# Patient Record
Sex: Female | Born: 1955 | Race: White | Hispanic: No | Marital: Married
Health system: Southern US, Community
[De-identification: ages and names within clinical notes are randomized; demographics above are authoritative.]

---

## 2001-05-07 ENCOUNTER — Encounter: Payer: Self-pay | Admitting: Emergency Medicine

## 2001-05-07 ENCOUNTER — Emergency Department (HOSPITAL_COMMUNITY): Admission: EM | Admit: 2001-05-07 | Discharge: 2001-05-07 | Payer: Self-pay | Admitting: Emergency Medicine

## 2003-09-03 ENCOUNTER — Encounter: Payer: Self-pay | Admitting: Neurosurgery

## 2003-09-07 ENCOUNTER — Encounter: Payer: Self-pay | Admitting: Neurosurgery

## 2003-09-07 ENCOUNTER — Inpatient Hospital Stay (HOSPITAL_COMMUNITY): Admission: RE | Admit: 2003-09-07 | Discharge: 2003-09-08 | Payer: Self-pay | Admitting: Neurosurgery

## 2003-09-22 ENCOUNTER — Encounter: Admission: RE | Admit: 2003-09-22 | Discharge: 2003-09-22 | Payer: Self-pay | Admitting: Neurosurgery

## 2004-08-18 ENCOUNTER — Ambulatory Visit: Payer: Self-pay | Admitting: Pain Medicine

## 2004-09-13 ENCOUNTER — Ambulatory Visit: Payer: Self-pay | Admitting: Pediatrics

## 2004-10-13 ENCOUNTER — Ambulatory Visit: Payer: Self-pay | Admitting: Pain Medicine

## 2004-11-15 ENCOUNTER — Ambulatory Visit: Payer: Self-pay | Admitting: Pain Medicine

## 2004-12-15 ENCOUNTER — Ambulatory Visit: Payer: Self-pay | Admitting: Pain Medicine

## 2005-01-12 ENCOUNTER — Ambulatory Visit: Payer: Self-pay | Admitting: Pain Medicine

## 2005-02-07 ENCOUNTER — Ambulatory Visit: Payer: Self-pay | Admitting: Pain Medicine

## 2005-03-14 ENCOUNTER — Ambulatory Visit: Payer: Self-pay | Admitting: Pain Medicine

## 2005-03-22 ENCOUNTER — Ambulatory Visit: Payer: Self-pay | Admitting: Pain Medicine

## 2005-04-13 ENCOUNTER — Ambulatory Visit: Payer: Self-pay | Admitting: Pain Medicine

## 2005-04-28 ENCOUNTER — Ambulatory Visit: Payer: Self-pay | Admitting: Internal Medicine

## 2005-05-01 ENCOUNTER — Ambulatory Visit: Payer: Self-pay | Admitting: Internal Medicine

## 2005-05-20 ENCOUNTER — Emergency Department: Payer: Self-pay | Admitting: Emergency Medicine

## 2005-05-20 ENCOUNTER — Other Ambulatory Visit: Payer: Self-pay

## 2005-06-01 ENCOUNTER — Ambulatory Visit: Payer: Self-pay | Admitting: Pain Medicine

## 2005-06-27 ENCOUNTER — Ambulatory Visit: Payer: Self-pay | Admitting: Pain Medicine

## 2005-08-08 ENCOUNTER — Ambulatory Visit: Payer: Self-pay | Admitting: Pain Medicine

## 2005-09-05 ENCOUNTER — Ambulatory Visit: Payer: Self-pay | Admitting: Pain Medicine

## 2005-10-03 ENCOUNTER — Ambulatory Visit: Payer: Self-pay | Admitting: Pain Medicine

## 2005-10-31 ENCOUNTER — Ambulatory Visit: Payer: Self-pay | Admitting: Pain Medicine

## 2005-11-28 ENCOUNTER — Ambulatory Visit: Payer: Self-pay | Admitting: Pain Medicine

## 2005-12-09 ENCOUNTER — Other Ambulatory Visit: Payer: Self-pay

## 2005-12-09 ENCOUNTER — Inpatient Hospital Stay: Payer: Self-pay | Admitting: Internal Medicine

## 2005-12-28 ENCOUNTER — Ambulatory Visit: Payer: Self-pay | Admitting: Pain Medicine

## 2006-01-23 ENCOUNTER — Ambulatory Visit: Payer: Self-pay | Admitting: Pain Medicine

## 2006-02-20 ENCOUNTER — Ambulatory Visit: Payer: Self-pay | Admitting: Pain Medicine

## 2006-03-20 ENCOUNTER — Ambulatory Visit: Payer: Self-pay | Admitting: Pain Medicine

## 2006-04-24 ENCOUNTER — Ambulatory Visit: Payer: Self-pay | Admitting: Pain Medicine

## 2006-05-24 ENCOUNTER — Ambulatory Visit: Payer: Self-pay | Admitting: Pain Medicine

## 2006-06-19 ENCOUNTER — Ambulatory Visit: Payer: Self-pay | Admitting: Pain Medicine

## 2006-06-27 ENCOUNTER — Ambulatory Visit: Payer: Self-pay | Admitting: Pain Medicine

## 2006-07-24 ENCOUNTER — Ambulatory Visit: Payer: Self-pay | Admitting: Pain Medicine

## 2006-08-23 ENCOUNTER — Ambulatory Visit: Payer: Self-pay | Admitting: Pain Medicine

## 2006-09-03 ENCOUNTER — Ambulatory Visit: Payer: Self-pay | Admitting: Pain Medicine

## 2006-09-13 ENCOUNTER — Ambulatory Visit: Payer: Self-pay | Admitting: Pain Medicine

## 2006-10-05 ENCOUNTER — Emergency Department: Payer: Self-pay | Admitting: Emergency Medicine

## 2006-10-05 ENCOUNTER — Other Ambulatory Visit: Payer: Self-pay

## 2006-10-18 ENCOUNTER — Ambulatory Visit: Payer: Self-pay | Admitting: Pain Medicine

## 2006-11-22 ENCOUNTER — Ambulatory Visit: Payer: Self-pay | Admitting: Pain Medicine

## 2006-12-18 ENCOUNTER — Ambulatory Visit: Payer: Self-pay | Admitting: Pain Medicine

## 2007-01-17 ENCOUNTER — Ambulatory Visit: Payer: Self-pay | Admitting: Pain Medicine

## 2007-02-21 ENCOUNTER — Ambulatory Visit: Payer: Self-pay | Admitting: Pain Medicine

## 2007-03-19 ENCOUNTER — Ambulatory Visit: Payer: Self-pay | Admitting: Pain Medicine

## 2007-04-16 ENCOUNTER — Ambulatory Visit: Payer: Self-pay | Admitting: Pain Medicine

## 2007-04-17 ENCOUNTER — Ambulatory Visit: Payer: Self-pay | Admitting: Neurosurgery

## 2007-05-21 ENCOUNTER — Ambulatory Visit: Payer: Self-pay | Admitting: Pain Medicine

## 2007-06-20 ENCOUNTER — Ambulatory Visit: Payer: Self-pay | Admitting: Pain Medicine

## 2007-06-21 ENCOUNTER — Ambulatory Visit (HOSPITAL_COMMUNITY): Admission: RE | Admit: 2007-06-21 | Discharge: 2007-06-21 | Payer: Self-pay | Admitting: Neurosurgery

## 2007-07-16 ENCOUNTER — Ambulatory Visit: Payer: Self-pay | Admitting: Pain Medicine

## 2007-07-16 ENCOUNTER — Emergency Department: Payer: Self-pay | Admitting: Emergency Medicine

## 2007-07-22 ENCOUNTER — Ambulatory Visit: Payer: Self-pay | Admitting: Emergency Medicine

## 2007-07-24 ENCOUNTER — Ambulatory Visit: Payer: Self-pay | Admitting: Internal Medicine

## 2007-07-26 ENCOUNTER — Emergency Department: Payer: Self-pay

## 2007-07-31 ENCOUNTER — Ambulatory Visit: Payer: Self-pay | Admitting: Internal Medicine

## 2007-08-15 ENCOUNTER — Ambulatory Visit: Payer: Self-pay | Admitting: Pain Medicine

## 2007-08-20 ENCOUNTER — Emergency Department: Payer: Self-pay | Admitting: Internal Medicine

## 2007-09-17 ENCOUNTER — Ambulatory Visit: Payer: Self-pay | Admitting: Pain Medicine

## 2007-10-11 ENCOUNTER — Emergency Department: Payer: Self-pay | Admitting: Emergency Medicine

## 2007-10-11 ENCOUNTER — Other Ambulatory Visit: Payer: Self-pay

## 2007-10-17 ENCOUNTER — Ambulatory Visit: Payer: Self-pay | Admitting: Pain Medicine

## 2007-11-04 ENCOUNTER — Emergency Department: Payer: Self-pay | Admitting: Internal Medicine

## 2007-11-08 ENCOUNTER — Emergency Department: Payer: Self-pay | Admitting: Unknown Physician Specialty

## 2007-11-08 ENCOUNTER — Other Ambulatory Visit: Payer: Self-pay

## 2007-11-19 ENCOUNTER — Ambulatory Visit: Payer: Self-pay | Admitting: Pain Medicine

## 2007-12-17 ENCOUNTER — Ambulatory Visit: Payer: Self-pay | Admitting: Pain Medicine

## 2007-12-23 ENCOUNTER — Other Ambulatory Visit: Payer: Self-pay

## 2007-12-23 ENCOUNTER — Inpatient Hospital Stay: Payer: Self-pay | Admitting: Internal Medicine

## 2008-01-14 ENCOUNTER — Ambulatory Visit: Payer: Self-pay | Admitting: Pain Medicine

## 2008-01-24 ENCOUNTER — Emergency Department: Payer: Self-pay | Admitting: Emergency Medicine

## 2008-01-24 ENCOUNTER — Other Ambulatory Visit: Payer: Self-pay

## 2008-02-09 ENCOUNTER — Emergency Department: Payer: Self-pay | Admitting: Emergency Medicine

## 2008-02-09 ENCOUNTER — Other Ambulatory Visit: Payer: Self-pay

## 2008-02-11 ENCOUNTER — Ambulatory Visit: Payer: Self-pay | Admitting: Pain Medicine

## 2008-02-28 ENCOUNTER — Observation Stay: Payer: Self-pay | Admitting: Internal Medicine

## 2008-02-28 ENCOUNTER — Other Ambulatory Visit: Payer: Self-pay

## 2008-02-28 ENCOUNTER — Ambulatory Visit: Payer: Self-pay | Admitting: Family Medicine

## 2008-03-06 ENCOUNTER — Emergency Department: Payer: Self-pay | Admitting: Emergency Medicine

## 2008-03-17 ENCOUNTER — Ambulatory Visit: Payer: Self-pay | Admitting: Pain Medicine

## 2008-03-22 ENCOUNTER — Other Ambulatory Visit: Payer: Self-pay

## 2008-03-22 ENCOUNTER — Emergency Department: Payer: Self-pay | Admitting: Unknown Physician Specialty

## 2008-03-23 ENCOUNTER — Observation Stay: Payer: Self-pay | Admitting: Internal Medicine

## 2008-03-23 ENCOUNTER — Other Ambulatory Visit: Payer: Self-pay

## 2008-03-25 ENCOUNTER — Ambulatory Visit: Payer: Self-pay | Admitting: Urology

## 2008-04-02 ENCOUNTER — Ambulatory Visit: Payer: Self-pay | Admitting: Urology

## 2008-04-15 ENCOUNTER — Ambulatory Visit: Payer: Self-pay | Admitting: Urology

## 2008-04-16 ENCOUNTER — Ambulatory Visit: Payer: Self-pay | Admitting: Pain Medicine

## 2008-04-17 ENCOUNTER — Ambulatory Visit: Payer: Self-pay | Admitting: Family Medicine

## 2008-04-23 ENCOUNTER — Ambulatory Visit: Payer: Self-pay | Admitting: Urology

## 2008-05-11 ENCOUNTER — Other Ambulatory Visit: Payer: Self-pay

## 2008-05-11 ENCOUNTER — Emergency Department: Payer: Self-pay | Admitting: Emergency Medicine

## 2008-05-25 ENCOUNTER — Ambulatory Visit: Payer: Self-pay | Admitting: Surgery

## 2008-05-28 ENCOUNTER — Inpatient Hospital Stay: Payer: Self-pay | Admitting: Surgery

## 2008-06-02 ENCOUNTER — Inpatient Hospital Stay: Payer: Self-pay | Admitting: Surgery

## 2008-09-14 ENCOUNTER — Ambulatory Visit: Payer: Self-pay | Admitting: Family Medicine

## 2008-10-14 ENCOUNTER — Ambulatory Visit: Payer: Self-pay | Admitting: Neurology

## 2008-11-11 ENCOUNTER — Emergency Department: Payer: Self-pay | Admitting: Emergency Medicine

## 2008-11-18 ENCOUNTER — Ambulatory Visit: Payer: Self-pay | Admitting: Neurology

## 2008-11-20 ENCOUNTER — Inpatient Hospital Stay: Payer: Self-pay | Admitting: Internal Medicine

## 2009-01-15 ENCOUNTER — Emergency Department: Payer: Self-pay | Admitting: Emergency Medicine

## 2009-02-20 ENCOUNTER — Emergency Department: Payer: Self-pay | Admitting: Emergency Medicine

## 2009-02-28 ENCOUNTER — Emergency Department: Payer: Self-pay | Admitting: Emergency Medicine

## 2009-03-03 ENCOUNTER — Emergency Department: Payer: Self-pay | Admitting: Emergency Medicine

## 2009-03-27 ENCOUNTER — Emergency Department: Payer: Self-pay | Admitting: Emergency Medicine

## 2009-04-22 ENCOUNTER — Inpatient Hospital Stay: Payer: Self-pay | Admitting: Internal Medicine

## 2009-08-09 ENCOUNTER — Emergency Department: Payer: Self-pay | Admitting: Emergency Medicine

## 2009-08-17 ENCOUNTER — Emergency Department: Payer: Self-pay | Admitting: Internal Medicine

## 2009-10-09 ENCOUNTER — Emergency Department: Payer: Self-pay | Admitting: Emergency Medicine

## 2010-01-13 ENCOUNTER — Emergency Department: Payer: Self-pay | Admitting: Unknown Physician Specialty

## 2010-01-25 ENCOUNTER — Ambulatory Visit: Payer: Self-pay | Admitting: Family Medicine

## 2010-07-13 ENCOUNTER — Ambulatory Visit: Payer: Self-pay | Admitting: Family Medicine

## 2010-09-13 ENCOUNTER — Ambulatory Visit: Payer: Self-pay | Admitting: Gastroenterology

## 2010-09-15 LAB — PATHOLOGY REPORT

## 2010-09-19 ENCOUNTER — Ambulatory Visit: Payer: Self-pay | Admitting: Internal Medicine

## 2010-10-09 ENCOUNTER — Emergency Department: Payer: Self-pay | Admitting: Emergency Medicine

## 2010-10-10 ENCOUNTER — Inpatient Hospital Stay: Payer: Self-pay | Admitting: Vascular Surgery

## 2010-11-28 ENCOUNTER — Emergency Department (HOSPITAL_COMMUNITY)
Admission: EM | Admit: 2010-11-28 | Discharge: 2010-11-28 | Payer: Self-pay | Source: Home / Self Care | Admitting: Emergency Medicine

## 2010-11-30 LAB — CBC
HCT: 43.2 % (ref 36.0–46.0)
Hemoglobin: 13.6 g/dL (ref 12.0–15.0)
MCH: 28.2 pg (ref 26.0–34.0)
MCHC: 31.5 g/dL (ref 30.0–36.0)
MCV: 89.4 fL (ref 78.0–100.0)
Platelets: 283 10*3/uL (ref 150–400)
RBC: 4.83 MIL/uL (ref 3.87–5.11)
RDW: 14 % (ref 11.5–15.5)
WBC: 7.4 10*3/uL (ref 4.0–10.5)

## 2010-11-30 LAB — POCT CARDIAC MARKERS
CKMB, poc: 1.5 ng/mL (ref 1.0–8.0)
CKMB, poc: 1.2 ng/mL (ref 1.0–8.0)
Myoglobin, poc: 35.5 ng/mL (ref 12–200)
Myoglobin, poc: 57.3 ng/mL (ref 12–200)
Troponin i, poc: <0.05 ng/mL (ref 0.00–0.09)
Troponin i, poc: <0.05 ng/mL (ref 0.00–0.09)

## 2010-11-30 LAB — BASIC METABOLIC PANEL
BUN: 12 mg/dL (ref 6–23)
CO2: 26 mEq/L (ref 19–32)
Calcium: 9.8 mg/dL (ref 8.4–10.5)
Chloride: 108 mEq/L (ref 96–112)
Creatinine, Ser: 0.89 mg/dL (ref 0.4–1.2)
GFR calc Af Amer: >60 mL/min (ref >60)
GFR calc non Af Amer: >60 mL/min (ref >60)
Glucose, Bld: 107 mg/dL — ABNORMAL HIGH (ref 70–99)
Potassium: 3.9 mEq/L (ref 3.5–5.1)
Sodium: 142 mEq/L (ref 135–145)

## 2010-12-01 ENCOUNTER — Ambulatory Visit: Admit: 2010-12-01 | Payer: Self-pay | Admitting: Cardiovascular Disease

## 2011-01-01 ENCOUNTER — Inpatient Hospital Stay: Payer: Self-pay | Admitting: Specialist

## 2011-03-31 NOTE — Consult Note (Signed)
[place]  Patient:    Nancy [name], Nancy [name]                      MRN: [id number] Proc. Date: 05/07/01 Adm. Date:  [id number] Attending:  Devoria Albe CC:         Cecille Amsterdam, M.D., [facility]  Dr. Madaline Guthrie   Consultation Report  REFERRING:  Cecille Amsterdam, M.D., Cardiology, [facility]  CHIEF COMPLAINT:  Chest pain.  HISTORY OF PRESENT ILLNESS:  This is a 55 year old white female with a history of coronary disease followed at [facility].  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 Her most recent episode of chest pain was in March, when she was admitted to [facility] and had an intervention.  Apparently, she was told she was not a surgical candidate because of her young age and concern over need for redo surgery.  PCI was recommended.  Since March, she has had intermittent chest pain but over the past several days has had increasing chest pain, mainly with exertion or with stress, but now with rest as well.  The pain is improved with rest and only morphine.  There is no significant improvement with nitroglycerin.  She has associated shortness of breath, diaphoresis, nausea, as well as radiation to her left arm with pain.  Her family claims she has not eaten in two weeks because of severe nausea.  She has also been noting increasing lower extremity edema for several weeks.  She has had some palpitations throughout.  PAST MEDICAL HISTORY:  Coronary disease, status post multiple PCIs, followed at [facility].  Herniated disk, hypertension, hyperlipidemia, difficulty with balance, lower extremity edema and hand edema, peptic ulcer disease.  PAST SURGICAL HISTORY:  She has had surgery for peptic ulcer disease in August 2001 and a hysterectomy.  ALLERGIES: 1. SUCCINYLCHOLINE. 2. ASPIRIN.  SOCIAL HISTORY:  She is married with  three daughters.  She smokes less than 1/2 pack per day for the past 30 years.  She denies alcohol or IV drug abuse.  FAMILY HISTORY:  Her father is alive at 11 and well.  Her mother is 11.  She had an MI and CABG in her 7s.  MEDICATIONS:  1. Toprol XL 100 mg a day.  2. Celexa 20 mg a day.  3. Altace 10 mg a day.  4. Klonopin 1 mg t.i.d.  5. Sublingual nitroglycerin.  6. Phenergan 25 mg q.4h. p.r.n.  7. Methadone 10 mg q.6h.  8. Imdur 30 mg a day.  9. Plavix 75 mg a day. 10. Ramipril 10 mg a day. 11. Xanax 5 mg t.i.d.  PHYSICAL EXAMINATION:  VITAL SIGNS:  Blood pressure 157/84 with a pulse of 80, respirations 20.  O2 saturation is 99% on room air.  GENERAL:  This is a well-developed, well-nourished white female in moderate distress secondary to chest pain.  NECK:  Supple without lymphadenopathy.  Carotid upstrokes +2 bilaterally.  No bruits.  LUNGS:  Clear to auscultation throughout.  HEART:  Regular rate and rhythm.  No murmurs, rubs, or gallops.  Normal S1, S2.  ABDOMEN:  Soft, nontender, nondistended with active bowel sounds.  No hepatosplenomegaly.  EXTREMITIES:  No cyanosis or edema.  Good distal pulses.  LABORATORY DATA:  Sodium 140, potassium 4.2, chloride 112, BUN 13, creatinine 0.6, glucose 105.  White cell count 7.4, hematocrit 34.3, hemoglobin 11.3, platelet count 523.  CPK 51, MB 0.6, troponin 0.01.  INR 1.  EKG shows normal sinus rhythm with nonspecific ST-T wave abnormalities.  ASSESSMENT AND PLAN:  Unstable angina only relieved with morphine.  She has a long history of coronary disease with multiple interventions at [facility] and is now on methadone for pain control.  She is followed in the pain clinic at [facility].  There is no evidence of acute ischemia on EKG and enzymes are negative despite severe chest pain for that past 24 hours.  The patient is hemodynamically stable with no evidence of acute ischemia.  The plan is to try to get ahold of her doctor,  who is Dr. Theophilus Bones at [facility], and arrange for transfer to [facility] for further evaluation, since all of her cardiac care has been performed there and she has had multiple interventions. DD:  05/07/01 TD:  05/08/01 Job: [id number]

## 2011-03-31 NOTE — Op Note (Signed)
NAME:  Nancy [name], Nancy [name]                         ACCOUNT NO.:  [id number]   MEDICAL RECORD NO.:  [id number]                   PATIENT TYPE:  INP   LOCATION:  3011                                 FACILITY:  [facility]   PHYSICIAN:  Donalee Citrin, M.D.                     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   OPERATION PERFORMED:   SURGEON:  Donalee Citrin, M.D.   ASSISTANT:  Kathaleen Maser. Pool, M.D.   ANESTHESIA:  General endotracheal.   INDICATIONS FOR PROCEDURE:  The patient is a pleasant 55 year old female who  has had longstanding back and bilateral leg pain.  This has been going on  for several years refractory to conservative treatment.  Patient managed at  pain clinic and preoperative images showed a thoracic spinal cord syrinx  approximately 6 to 7 mm in diameter.  Distal to this was a large ruptured  disk at T11-12 compressing the spinal cord and the left sided T11 nerve  root.  Due to the spinal cord compression, T11 nerve root compression,  patient's back pain and __________  syrinx, the patient was recommended  decompressive laminectomy and resection of ruptured disk __________  .  I  extensively went over the risks and benefits of a decompressive thoracic  laminectomy and transpedicular decompression out the nerve root and  diskectomy.  The patient understands and agreed to proceed forward.   DESCRIPTION OF PROCEDURE:  The patient was brought to the operating room and  was induced under general anesthesia, placed prone on a Wilson frame, back  prepped and draped in the usual sterile fashion.  Preoperative x-ray  localized the T11-12 disk space.  A midline incision was  made after  infiltrating 10mL of lidocaine with epinephrine and Bovie electrocautery was  used to dissect subcutaneous tissues.  Subperiosteal dissection was carried  out to the lamina of T11 and T12.  First intraoperative x-ray confirmed our  position at the T10-11 disk space and the incision was extended inferiorly.  Repeat x-ray confirmed the 11-12 disk space counting from below.  Then using  high speed drill the medial aspect of the facet complex and inferior aspect  of lamina of T11 was drilled down.  Then using a 2-3 mm Kerrison punch,  decompressive laminectomy at T11-12 was performed.  Then the proximal aspect  of the T11 nerve root was identified and a perineurial cyst was visualized.  This was also, in hindsight, visible on MRI.  The T12 nerve root was also  identified and the medial aspect of the T12 pedicle was identified.  Then  using a high speed drill, the pedicle was drilled down to get into the disk  space with minimal retraction on the thoracic spinal cord.  Annulotomy was  made in the disk space and the medial aspect of the pedicle at T12 was  drilled down and then from a lateral approach several fragments of disk and  partially calcified were removed from underneath the T11 nerve root which  had radically decompressed and out its foramen.  Using a D'Errico nerve root  retractor gently reflecting the thecal sac medially and using a small  suction cannula displacing the 11 nerve root cephalad, several loose  calcified fragments were removed from beneath the axilla of T11 nerve root  as well as from underneath the spinal cord medially.  Again care was taken,  from the lateral approach to have minimal manipulation of the spinal cord at  this level.  Several fragments were removed and the thoracic spinal cord and  T11 nerve root were decompressed.  At the end of the diskectomy there was no  further stenosis appreciated on the T11 nerve root or any of the thoracic  spinal  cord.  The wound was copiously irrigated.  Meticulous hemostasis was  maintained.  Gelfoam was overlaid atop the dura.  The muscle and fascia were  reapproximated with 0 interrupted Vicryl.  The subcutaneous tissue was  closed with 2-0 interrupted Vicryl, skin closed with __________  subcuticular.  Benzoin and Steri-Strips applied.  The patient was then  transferred to the recovery room in stable condition.  At the end of the  case, sponge and needle counts were correct.                                                Donalee Citrin, M.D.    GC/MEDQ  D:  09/07/2003  T:  09/07/2003  Job:  [id number]

## 2011-08-28 LAB — COMPREHENSIVE METABOLIC PANEL
ALT: 25
AST: 23
Albumin: 4.2
CO2: 24
Calcium: 10.4
GFR calc Af Amer: >60
Sodium: 137
Total Protein: 7.5

## 2011-08-28 LAB — CBC
MCHC: 33.1
RBC: 5.54 — ABNORMAL HIGH
RDW: 14.9 — ABNORMAL HIGH

## 2011-10-11 ENCOUNTER — Inpatient Hospital Stay: Payer: Self-pay | Admitting: Internal Medicine

## 2012-01-16 IMAGING — CT CT ABD-PELV W/ CM
1 of 2 series · 15 of 32 positions shown, 19 images · non-contrast
Comparison: none

REASON FOR EXAM: (1) abd pain; (2) abd pain
COMMENTS:

[Series 2: 3mm soft tissue · axial · 0.84mm/px · z∈[-1086,-662]mm · 15 of 155 slices shown, 19 images]
[im 7/155  soft-tissue]
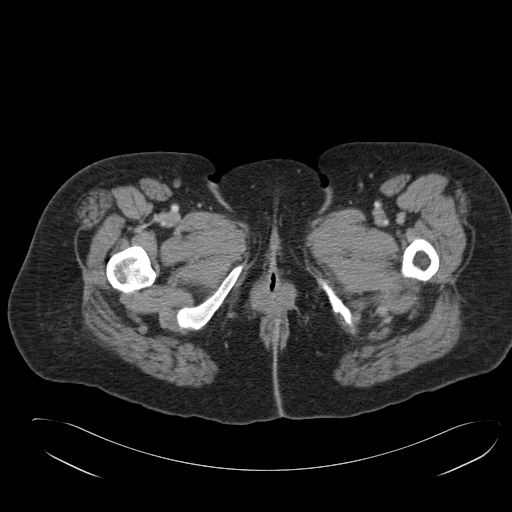
[im 7/155  bone]
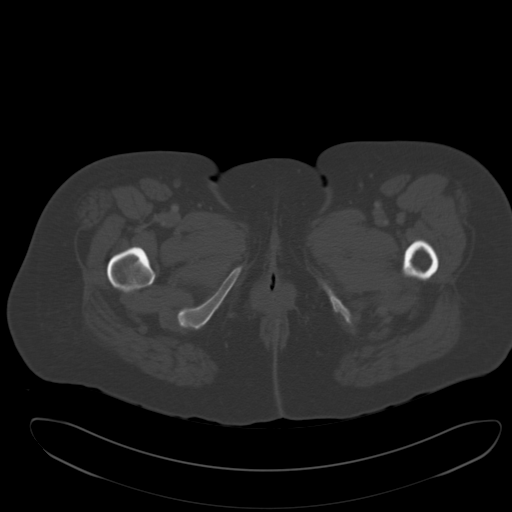
[im 21/155  soft-tissue]
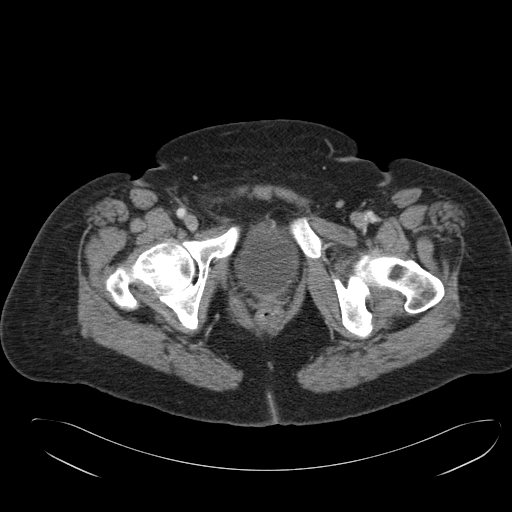
[im 34/155  soft-tissue]
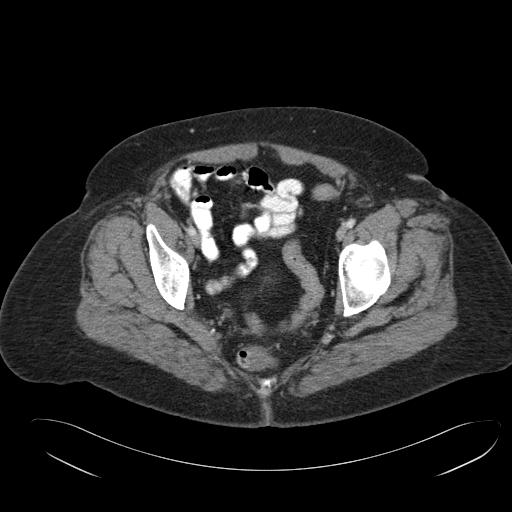
[im 41/155  soft-tissue]
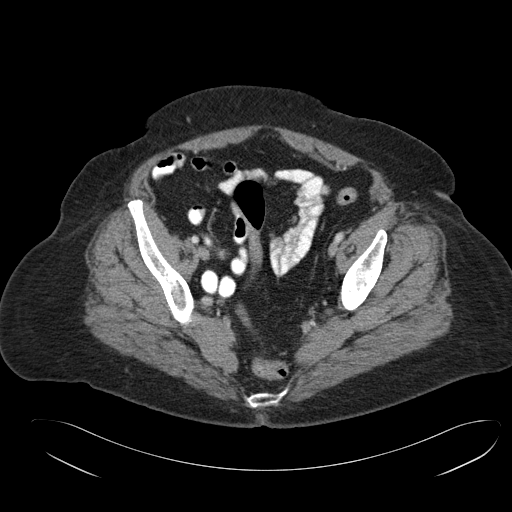
[im 54/155  soft-tissue]
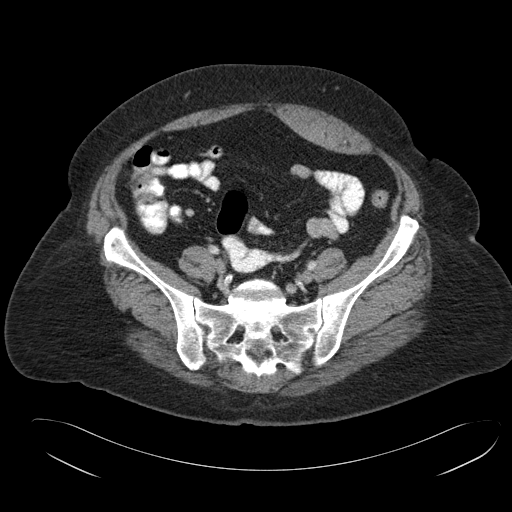
[im 67/155  soft-tissue]
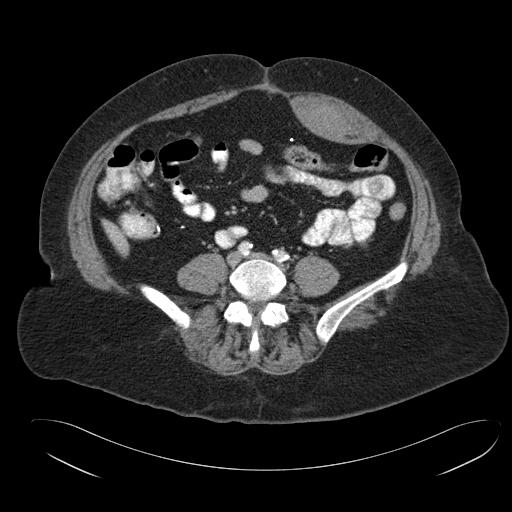
[im 81/155  soft-tissue]
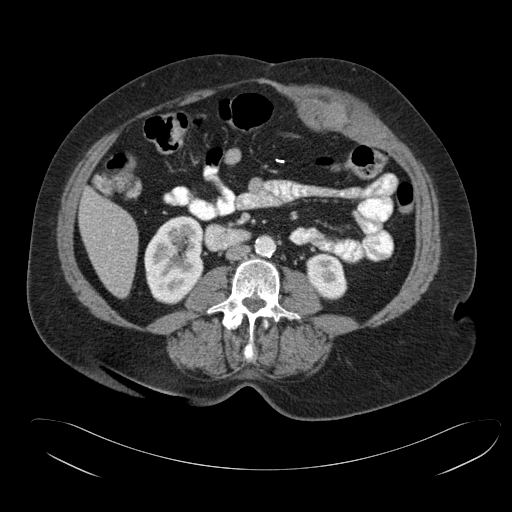
[im 88/155  soft-tissue]
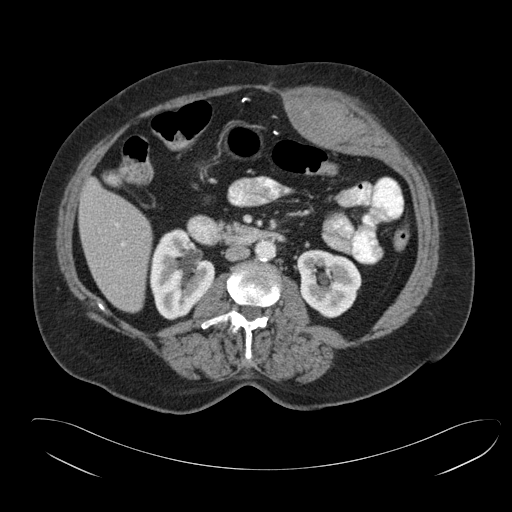
[im 101/155  soft-tissue]
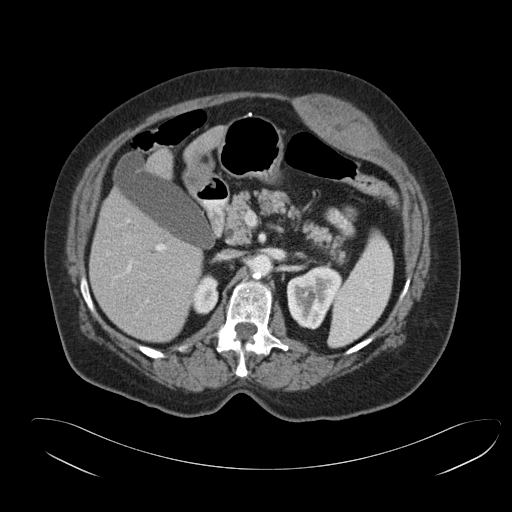
[im 101/155  bone]
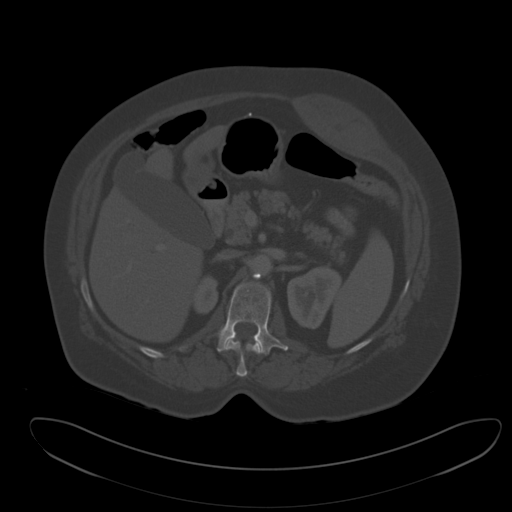
[im 114/155  soft-tissue]
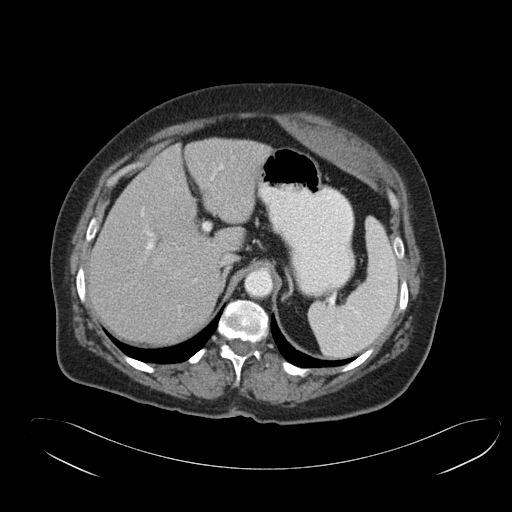
[im 121/155  soft-tissue]
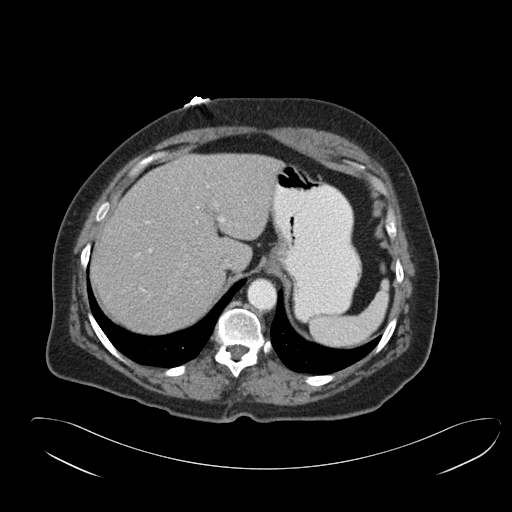
[im 128/155  lung]
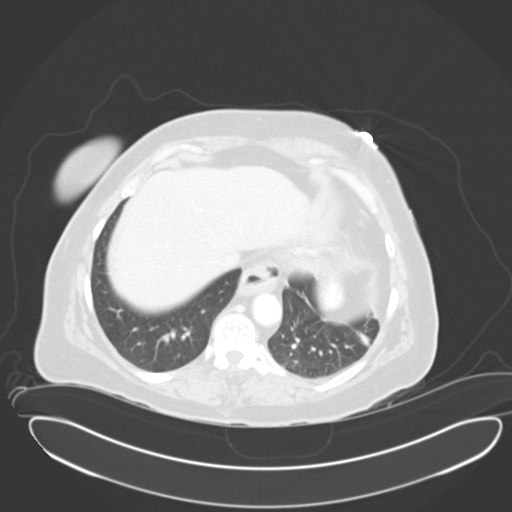
[im 134/155  soft-tissue]
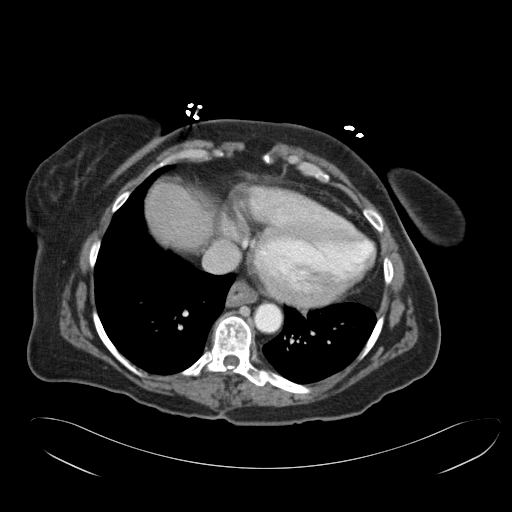
[im 134/155  lung]
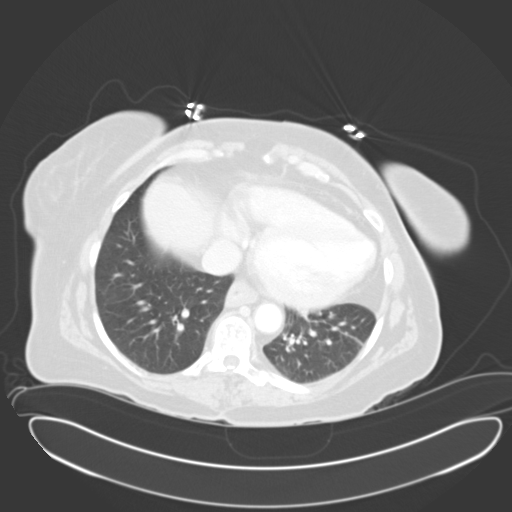
[im 141/155  lung]
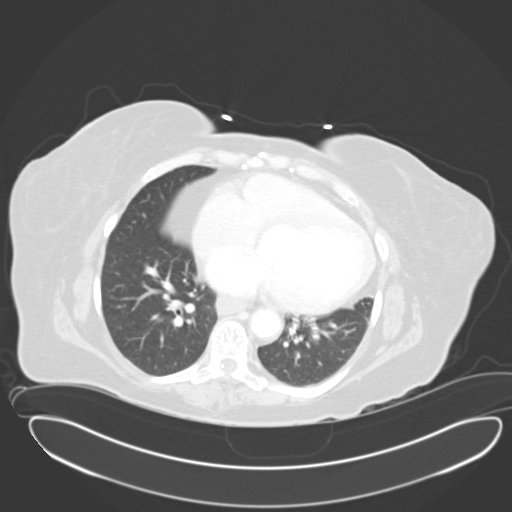
[im 148/155  soft-tissue]
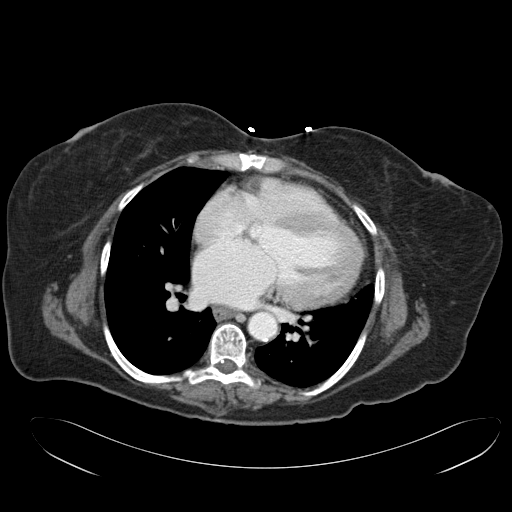
[im 148/155  lung]
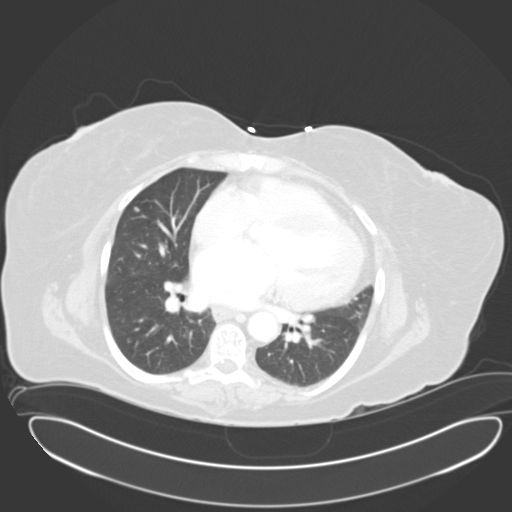

[15 of 32 positions shown; findings below may reference images not displayed]

PROCEDURE:     CT  - CT ABDOMEN / PELVIS  W  - October 11, 2011  [DATE]

RESULT:     CT of the abdomen and pelvis is performed with 100 mL of
Xsovue-HRB iodinated intravenous contrast and oral contrast. Images are
reconstructed at 3.0 mm slice thickness in the axial plane. Comparison is
made to images from the previous examination dated 12 October, 2010.

Images through the base the lungs demonstrate grossly normal aeration with
some linear atelectasis or fibrosis in the lingula. The cardiac size is
enlarged. There is a small sliding-type hiatal hernia. Oral contrast is
present within the stomach, small bowel and portions of the colon. No free
air is evident. No gallstones are evident. The liver shows no discrete mass.
The pancreas is unremarkable. The adrenal glands appear normal. The aorta is
normal in caliber. The urinary bladder is intact.

The left of midline in the abdominal wall there is significant enlargement
suggestive of a hematoma with an oblique anterior posterior dimension of
4.76 cm extending inferiorly to the infraumbilical level and superiorly
almost to the level of the costal margin. From medial to lateral this
extends over approximately 9.52 cm. This was not present on the previously
mentioned CT of the abdomen and pelvis. Some areas of density within this
suggest the possibility of active hemorrhage seen on images 59 through 66.
Kidneys demonstrate no obstruction. The findings were discussed with Dr.
Jaqb at the completion of dictation.
IMPRESSION: 1. Findings consistent with left abdominal wall rectus hematoma. There is
evidence of active bleeding.
2. Small hiatal hernia.
3. Cardiomegaly.(*)

## 2012-04-10 ENCOUNTER — Inpatient Hospital Stay: Payer: Self-pay | Admitting: Internal Medicine

## 2012-04-10 LAB — COMPREHENSIVE METABOLIC PANEL
Albumin: 4 g/dL (ref 3.4–5.0)
Anion Gap: 8 (ref 7–16)
BUN: 13 mg/dL (ref 7–18)
Calcium, Total: 9 mg/dL (ref 8.5–10.1)
Co2: 26 mmol/L (ref 21–32)
EGFR (African American): >60
SGOT(AST): 23 U/L (ref 15–37)
SGPT (ALT): 17 U/L
Total Protein: 7.5 g/dL (ref 6.4–8.2)

## 2012-04-10 LAB — PROTIME-INR: Prothrombin Time: 14.7 secs (ref 11.5–14.7)

## 2012-04-10 LAB — CK TOTAL AND CKMB (NOT AT ARMC)
CK, Total: 107 U/L (ref 21–215)
CK-MB: 1.7 ng/mL (ref 0.5–3.6)

## 2012-04-10 LAB — DRUG SCREEN, URINE
Benzodiazepine, Ur Scrn: NEGATIVE (ref ?–200)
Cocaine Metabolite,Ur ~~LOC~~: NEGATIVE (ref ?–300)
Methadone, Ur Screen: NEGATIVE (ref ?–300)
Opiate, Ur Screen: POSITIVE (ref ?–300)
Tricyclic, Ur Screen: NEGATIVE (ref ?–1000)

## 2012-04-10 LAB — CBC: RDW: 15.4 % — ABNORMAL HIGH (ref 11.5–14.5)

## 2012-04-11 LAB — CBC WITH DIFFERENTIAL/PLATELET
Basophil %: 0.6 %
Eosinophil %: 1.9 %
Lymphocyte #: 2.4 10*3/uL (ref 1.0–3.6)
MCH: 30.6 pg (ref 26.0–34.0)
Monocyte #: 0.3 x10 3/mm (ref 0.2–0.9)
Monocyte %: 6.1 %
Neutrophil #: 2.6 10*3/uL (ref 1.4–6.5)
Neutrophil %: 48.3 %
RBC: 3.83 10*6/uL (ref 3.80–5.20)
RDW: 15.2 % — ABNORMAL HIGH (ref 11.5–14.5)
WBC: 5.5 10*3/uL (ref 3.6–11.0)

## 2012-04-11 LAB — BASIC METABOLIC PANEL
Anion Gap: 9 (ref 7–16)
BUN: 11 mg/dL (ref 7–18)
Calcium, Total: 8.5 mg/dL (ref 8.5–10.1)
Chloride: 103 mmol/L (ref 98–107)
Co2: 28 mmol/L (ref 21–32)
Creatinine: 0.71 mg/dL (ref 0.60–1.30)
EGFR (Non-African Amer.): >60
Glucose: 274 mg/dL — ABNORMAL HIGH (ref 65–99)
Sodium: 140 mmol/L (ref 136–145)

## 2012-04-11 LAB — APTT: Activated PTT: >160 secs (ref 23.6–35.9)

## 2012-04-11 LAB — LIPID PANEL: Ldl Cholesterol, Calc: 80 mg/dL (ref 0–100)

## 2012-07-14 DEATH — deceased

## 2015-03-07 NOTE — H&P (Signed)
PATIENT NAME:  Nancy [name] MR#:  [id number] DATE OF BIRTH:  [birth year]  DATE OF ADMISSION:  04/10/2012  REFERIsac SarnaRING PHYSICIAN: ER physician, Dr. Buford DresserFinnell   CARDIOLOGIST: Dr. Juliann Paresallwood    PRIMARY CARE PHYSICIAN: [facility]    CHIEF COMPLAINT: Chest pain.   HISTORY OF PRESENT ILLNESS: The patient is a 59 year old female with past medical history of coronary artery disease, status post CABG and 15 stents. The patient's last stress test was in February 2012 which showed chronic ischemic cardiomyopathy with EF of around 44%. Medical management was recommended. The patient's last catheterization is from November 2011 which showed severe native vessel CAD with 100% ramus, 100% RCA, and diffuse LAD 50 to 75% disease. The patient was advised medical management. The patient reports that a repeat cath was planned but has not been done so far. The patient reports that she has chronic angina, however, in the last one month she has been almost daily woken up by retrosternal chest pain for which she has been taking sublingual nitroglycerin. The patient reports that she ran out of full prescription of nitroglycerin within two weeks. Last night the pain was especially severe. It was retrosternal radiating to the back, neck, arm, and jaw. The patient woke up several times last night with the pain and finally decided to come to the Emergency Room because she ran out of her nitroglycerin. The patient has acid reflux and takes omeprazole. The patient is insistent that her symptoms are cardiac and not related to any acid reflux, although there was some esophageal thickening noted on her CT.   PAST MEDICAL HISTORY:  1. Hypertension.  2. Hyperlipidemia.  3. Diabetes, diet controlled.  4. Coronary artery disease status post coronary artery bypass graft. The patient reports that subsequently she has had 15 stents. The patient's last stress test was in February of 2012 which showed chronic scar and  ischemic cardiomyopathy. Medical management was recommended. Her last catheterization was in November 2011 at which time she was found to have severe native vessel CAD with 100% ramus, 100% RCA, diffuse LAD 50 to 75%. Medical management was recommended.  5. History of chronic systolic CHF with ischemic cardiomyopathy, EF 30 to 40%. 6. Chronic obstructive pulmonary disease.  7. Ongoing tobacco abuse.  8. History of substance abuse in the past with crack cocaine. 9. History of pain seeking behavior.  10. Chronic pain syndrome with chronic low back pain. 11. Kidney stones. 12. Gastroesophageal reflux disease. 13. Bipolar disorder. 14. Anxiety. 15. Fibromyalgia. 16. History of heart palpitations.  17. History of pseudocholinesterase deficiency.  18. The patient was [facility] November of 2012 with rectus sheath hematoma.   CURRENT MEDICATIONS:  1. Xanax 1 mg q.8 hours p.r.n.  2. Aspirin 81 mg daily.  3. Flexeril 10 mg q.8 hours p.r.n.  4. Imdur 90 mg daily.  5. Lasix 80 mg daily. 6. Lexapro 20 mg daily.  7. Lipitor 20 mg daily.  8. Lisinopril 2.5 mg daily.  9. Lopressor 25 mg b.i.d.  10. Omeprazole 20 mg daily.  11. Promethazine 25 mg q.6 hours p.r.n. 12. Seroquel 300 mg at bedtime.  13. Ranexa 1000 mg b.i.d.   ALLERGIES: Aspirin, succinylcholine. However, the patient is taking aspirin and it is listed as one of the medications.   SOCIAL HISTORY: The patient reports she smokes. She was a two pack smoker, currently smokes 3 to 4 cigarettes per day. Denies any alcohol abuse. Has a history of crack cocaine  in the past. Reports that she hasn't used any drugs in the last four years. She lives alone.   FAMILY HISTORY: Both parents had heart disease.   REVIEW OF SYSTEMS: CONSTITUTIONAL: Reports fever, fatigue. EYES: Denies any blurred or double vision. ENT: Denies any tinnitus, ear pain. RESPIRATORY: Denies any cough, painful respiration. CARDIOVASCULAR: Reports chest pain.  Denies any palpitations, syncope. GI: Denies any nausea, vomiting, diarrhea, abdominal pain. GU: Denies any dysuria or hematuria. ENDOCRINE: Denies any polyuria or nocturia. HEME/LYMPH: Denies any anemia or easy bruisability. INTEGUMENTARY: Denies any active rash. MUSCULOSKELETAL: Has chronic back pain. Denies any swelling or gout. NEUROLOGICAL: Denies any numbness or weakness. PSYCH: Has history of anxiety and depression.   PHYSICAL EXAMINATION:   VITAL SIGNS: Temperature 98, heart rate 84, respiratory rate 20, blood pressure 164/91, pulse oximetry 98%.   GENERAL: The patient is an overweight Caucasian female sitting comfortably in bed not in acute distress.   HEAD: Atraumatic, normocephalic.   EYES: There is some pallor. No icterus or cyanosis. Pupils equal, round, and reactive to light and accommodation. Extraocular movements intact.    ENT: Wet mucous membranes. No oropharyngeal erythema or thrush.   NECK: Supple. No masses. No JVD. No thyromegaly or lymphadenopathy.   CHEST WALL: No tenderness to palpation. Not using accessory muscles of respiration. No intercostal muscle retractions.   LUNGS: The patient has bilateral wheezing. No rhonchi or crepitus.   CARDIOVASCULAR: S1, S2 regular. No murmurs, rubs, or gallops.   ABDOMEN: Soft, nontender, nondistended. No guarding. No rigidity. No organomegaly. Normal bowel sounds.   SKIN: No rashes or lesions.   PERIPHERIES: No pedal edema. 1+ dorsalis pedis pulses.    MUSCULOSKELETAL: No cyanosis or clubbing.   NEUROLOGIC: Awake, alert, oriented x3. Nonfocal neurological exam.   PSYCH: Cranial nerves grossly intact. Normal mood and affect.   LABORATORY, DIAGNOSTIC, AND RADIOLOGICAL DATA: CT of the abdomen shows nonobstructing left renal stones. No evidence of obstruction or inflammatory abnormalities. Thickening of the distal esophagus.   Chest x-ray no pneumonia. Stable cardiomegaly.   Cardiac enzymes negative. CMP essentially  normal. CBC showed white count of 5, hemoglobin 11.9, hematocrit 35.4, platelets 103.   ASSESSMENT AND PLAN: This is a 59 year old female with past medical history of coronary artery disease status post stents, hypertension, hyperlipidemia, and diabetes who presents with chest pain.  1. Unstable angina. The patient has been using nitroglycerin almost daily for the last one month. Will admit the patient to the hospital. Start on a heparin drip. The patient reports that she is allergic to aspirin but she has been taking some aspirin. It is listed on the medication list. Will continue beta-blocker, ACE inhibitor, statin. Place on nitro paste. Continue Imdur and Ranexa. Will obtain a Cardiology consultation with Dr. Juliann Pares. Will also order an inpatient stress test.  2. Accelerated hypertension initially, better controlled at present, possibly due to acute stress. Will continue the patient's current medications and adjust as needed to achieve good hypertensive control.  3. Hyperlipidemia. Will check a fasting lipid profile and continue statin therapy.  4. Diabetes, appears to be diet controlled. The patient is not on any medications. Will place on an 1800 calorie ADA diet and insulin sliding scale.  5. Mild COPD exacerbation. The patient is having some wheezing. She is not on any inhalers. Will place her on her DuoNebs and Symbicort. The patient is afebrile with normal white count, no pneumonia on her chest x-ray, therefore, will hold off any antibiotics for the time being. I  see no indication for steroids currently. Will treat conservatively. 6. Smoking. The patient has been counseled about cessation for more than three minutes. Will provide with a nicotine patch.  7. Drug abuse. The patient reports that she has not used any drugs in the last four years. Will check urine drug screen.  8. History of bipolar disorder, anxiety, depression. Will continue Lexapro. 9. Gastroesophageal reflux disease. The patient  is on omeprazole. She denies any reflux symptoms. She says that her chest pain feels like cardiac chest pain. Will continue her PPI for the time being. The patient may require a GI evaluation as an outpatient.   Reviewed all medical records, discussed with the patient the plan of care and management.   TIME SPENT: 75 minutes.   ____________________________ Darrick Meigs, MD sp:drc D: 04/10/2012 17:33:00 ET T: 04/11/2012 07:00:21 ET JOB#: [id number]  cc: Darrick Meigs, MD, <Dictator> Hyman Hopes, MD Darrick Meigs MD ELECTRONICALLY SIGNED 04/13/2012 9:31

## 2015-03-07 NOTE — Discharge Summary (Signed)
PATIENT NAME:  Nancy [name], Nancy [name] MR#:  [id number] DATE OF BIRTH:  June 12, 1956  DATE OF ADMISSION:  04/10/2012 DATE OF DISCHARGE:  04/13/2012  <<MISSING TEXT>>   DISCHARGE DIAGNOSES: 1. Chest pain.  2. <<MISSING TEXT>> 3. <<MISSING TEXT>>  4. Anxiety. 5. Hypertension. 6. Chronic obstructive pulmonary disease. Patient has history of chronic obstructive pulmonary disease exacerbation.   DISCHARGE MEDICATIONS: 1. <<MISSING TEXT>> mg q.8 hours. 2. Lasix 40 mg daily. 3. Omeprazole 20 mg daily. 4. <<MISSING TEXT>>  5. <<MISSING TEXT>>  6. <<MISSING TEXT>>  7. <<MISSING TEXT>>  8. <<MISSING TEXT>>  9. <<MISSING TEXT>> 30 mg daily. 10. <<MISSING TEXT>>  11. <<MISSING TEXT>>  12. Metoprolol 20 mg p.o. b.i.d.  13. Seroquel 200 mg daily. 14. <<MISSING TEXT>>   NEW MEDICATIONS: 15. <<MISSING TEXT>>. 16. <<MISSING TEXT>>  17. <<MISSING TEXT>>  18. <<MISSING TEXT>> 20 mg daily. 19. <<MISSING TEXT>>  20. <<MISSING TEXT>>  21. <<MISSING TEXT>>   DIET: Low sodium diet.  ACTIVITY: <<MISSING TEXT>>   FOLLOW UP: <<MISSING TEXT>>   <<MISSING TEXT>>   LABORATORY, DIAGNOSTIC AND RADIOLOGICAL DATA: <<MISSING TEXT>> Chest x-ray on admission showed haziness in the <<MISSING TEXT>> . <<MISSING TEXT>>  ____________________________ Katha HammingSnehalatha Raylin Diguglielmo, MD sk:cms D: 04/13/2012 15:21:48 ET T: 04/13/2012 15:54:00 ET JOB#: [id number]  cc: Katha HammingSnehalatha Gabino Hagin, MD, <Dictator> please delete this dication,I did another dictation

## 2015-03-07 NOTE — Discharge Summary (Signed)
PATIENT NAME:  Nancy [name], Nancy [name] MR#:  [id number] DATE OF BIRTH:  03-23-1956  DATE OF ADMISSION:  04/10/2012 DATE OF DISCHARGE:  04/13/2012  DISCHARGE DIAGNOSES:  1. Chest pain due to unstable angina. 2. Chronic chest pain. 3. Chronic angina. 4. Chronic obstructive pulmonary disease exacerbation. 5. Multivessel coronary artery disease. 6. Anxiety. 7. Hypertension.  8. Hyperlipidemia.   DISCHARGE MEDICATIONS:  1. Xanax 1 mg q.8 hours as needed.  2. Lasix 80 mg daily.  3. Omeprazole 20 mg p.o. daily.  4. Phenergan 25 mg every six hours as needed.  5. Ranexa 1 gram extended release p.o. b.i.d.  6. Lisinopril 2.5 mg p.o. daily.  7. Aspirin 81 mg p.o. daily.  8. Imdur 30 mg 3 tabs daily. 9. Lipitor 20 mg daily.  10. Flexeril 10 mg every eight hours as needed. 11. Metoprolol tartrate 25 mg p.o. b.i.d.  12. Seroquel 300 mg at bedtime.  13. New medication: Prednisone tapering from 40 mg to 10 mg. 14. Dilaudid 2 mg every six hours p.r.n. for pain, 15 tablets are written. 15. Percocet 5/325 every six hours p.r.n. for pain, only 30 tablets are written.  FOLLOW UP: Follow up with Dr. Juliann Paresallwood and also with primary doctor, Dr. Lawerance BachBurns.   CONSULTATION: Cardiology consult with Dr. Juliann Paresallwood.  LABORATORY, DIAGNOSTIC AND RADIOLOGICAL DATA: EKG on admission normal sinus, 81 beats per minute. INR 1.1. Troponin less than 0.02. CBC essentially within normal limits. Electrolytes and kidney function were normal. Chest x-ray showed increased density in the left base compatible with overlying soft tissue and possibly of infiltrate or atelectasis cannot be excluded.   HOSPITAL COURSE:  This is a 59 year old female patient with history of coronary artery disease and 15 stents in the heart and ejection fraction of around 44% who follows with Dr. Juliann Paresallwood, came in because of chest pain. Patient has been having chest pain more with exertion and she has to take sublingual nitroglycerin all the time which only helps  to some extent with chest pain so she is admitted for chest pain with unstable angina. Troponins have been negative. EKG did not show any new changes. Patient started on nitro patch along with  medications, aspirin, beta blockers, ACE inhibitors and statins, Imdur and Ranexa. Cardiology consult obtained with Dr. Juliann Paresallwood. Patient is taken to the cardiac catheterization. Patient found to have severe multivessel disease but unamenable to any procedure and Dr. Juliann Paresallwood recommended medical management so heparin was discontinued and continued on home medications. Patient was given prescription for more sublingual nitroglycerin and advised to follow with Dr. Juliann Paresallwood. Patient also found to have some wheezing during the hospital stay along with history of chronic obstructive pulmonary disease and smoking. Started her on steroids and nebulizers. She did much better with steroids and nebulizers so a prescription for tapering course of steroids and advised to quit smoking and continue her home nebulizers.   Patient does have chronic pain syndrome and anxiety. Requested pain pills to help with chest pain and chronic back pains. She was given a small supply of Percocet and Dilaudid. She is advised to follow with her primary doctor.   TIME SPENT ON DISCHARGE PREPARATION: More than 30 minutes.      ____________________________ Katha HammingSnehalatha Rondle Lohse, MD sk:cms D: 04/16/2012 07:22:56 ET T: 04/16/2012 13:56:33 ET JOB#: [id number]  cc: Katha HammingSnehalatha Mialani Reicks, MD, <Dictator> Hyman HopesHarriett P. Burns, MD Katha HammingSNEHALATHA Sumner Boesch MD ELECTRONICALLY SIGNED 04/22/2012 14:00
